# Patient Record
Sex: Female | Born: 1985 | Race: Asian | Hispanic: No | Marital: Married | Smoking: Never smoker
Health system: Southern US, Community
[De-identification: ages and names within clinical notes are randomized; demographics above are authoritative.]

---

## 2009-02-10 ENCOUNTER — Inpatient Hospital Stay (HOSPITAL_COMMUNITY): Admission: AD | Admit: 2009-02-10 | Discharge: 2009-02-10 | Payer: Self-pay | Admitting: Obstetrics and Gynecology

## 2009-02-10 ENCOUNTER — Ambulatory Visit: Payer: Self-pay | Admitting: Obstetrics and Gynecology

## 2009-02-12 ENCOUNTER — Inpatient Hospital Stay (HOSPITAL_COMMUNITY): Admission: AD | Admit: 2009-02-12 | Discharge: 2009-02-13 | Payer: Self-pay | Admitting: Obstetrics and Gynecology

## 2009-04-21 ENCOUNTER — Ambulatory Visit: Payer: Self-pay | Admitting: Obstetrics & Gynecology

## 2009-04-21 ENCOUNTER — Encounter: Payer: Self-pay | Admitting: Obstetrics & Gynecology

## 2009-04-21 LAB — CONVERTED CEMR LAB
Antibody Screen: NEGATIVE
Basophils Relative: 0 % (ref 0–1)
Eosinophils Absolute: 0.2 10*3/uL (ref 0.0–0.7)
Eosinophils Relative: 2 % (ref 0–5)
HCT: 36.4 % (ref 36.0–46.0)
Hemoglobin: 12.1 g/dL (ref 12.0–15.0)
Hgb F Quant: 0 % (ref 0.0–2.0)
Hgb S Quant: 0 % (ref 0.0–0.0)
Lymphocytes Relative: 20 % (ref 12–46)
Lymphs Abs: 2 10*3/uL (ref 0.7–4.0)
Neutro Abs: 7.4 10*3/uL (ref 1.7–7.7)
RBC: 4.07 M/uL (ref 3.87–5.11)
Rubella: 116.2 intl units/mL — ABNORMAL HIGH
WBC: 10.1 10*3/uL (ref 4.0–10.5)

## 2009-04-27 ENCOUNTER — Inpatient Hospital Stay (HOSPITAL_COMMUNITY): Admission: AD | Admit: 2009-04-27 | Discharge: 2009-04-27 | Payer: Self-pay | Admitting: Obstetrics & Gynecology

## 2009-06-02 ENCOUNTER — Encounter: Payer: Self-pay | Admitting: Family Medicine

## 2009-06-02 ENCOUNTER — Ambulatory Visit (HOSPITAL_COMMUNITY): Admission: RE | Admit: 2009-06-02 | Discharge: 2009-06-02 | Payer: Self-pay | Admitting: Family Medicine

## 2009-06-12 ENCOUNTER — Ambulatory Visit (HOSPITAL_COMMUNITY): Admission: RE | Admit: 2009-06-12 | Discharge: 2009-06-12 | Payer: Self-pay | Admitting: Family Medicine

## 2009-07-14 ENCOUNTER — Ambulatory Visit: Payer: Self-pay | Admitting: Obstetrics & Gynecology

## 2009-07-17 ENCOUNTER — Ambulatory Visit (HOSPITAL_COMMUNITY): Admission: RE | Admit: 2009-07-17 | Discharge: 2009-07-17 | Payer: Self-pay | Admitting: Obstetrics & Gynecology

## 2009-07-20 ENCOUNTER — Ambulatory Visit: Payer: Self-pay | Admitting: Obstetrics & Gynecology

## 2009-07-21 ENCOUNTER — Ambulatory Visit: Payer: Self-pay | Admitting: Family

## 2009-07-21 ENCOUNTER — Inpatient Hospital Stay (HOSPITAL_COMMUNITY): Admission: EM | Admit: 2009-07-21 | Discharge: 2009-07-24 | Payer: Self-pay | Admitting: Family Medicine

## 2009-12-31 ENCOUNTER — Emergency Department (HOSPITAL_COMMUNITY): Admission: EM | Admit: 2009-12-31 | Discharge: 2009-12-31 | Payer: Self-pay | Admitting: Emergency Medicine

## 2010-06-25 IMAGING — US US OB LIMITED
1 series · 6 of 6 positions shown · non-contrast
Comparison: none

OBSTETRICAL ULTRASOUND:
 This ultrasound exam was performed in the [HOSPITAL] Ultrasound Department.  The OB US report was generated in the [facility] system, and faxed to the ordering physician.  This report is also available in [HOSPITAL]?s [employer] and in [REDACTED] PACS.

[Series 1: us fetal bpp w/o nonstress · non-contrast · 0.35mm/px · 6 acquisitions, 6 frames shown]
[im 1/6]
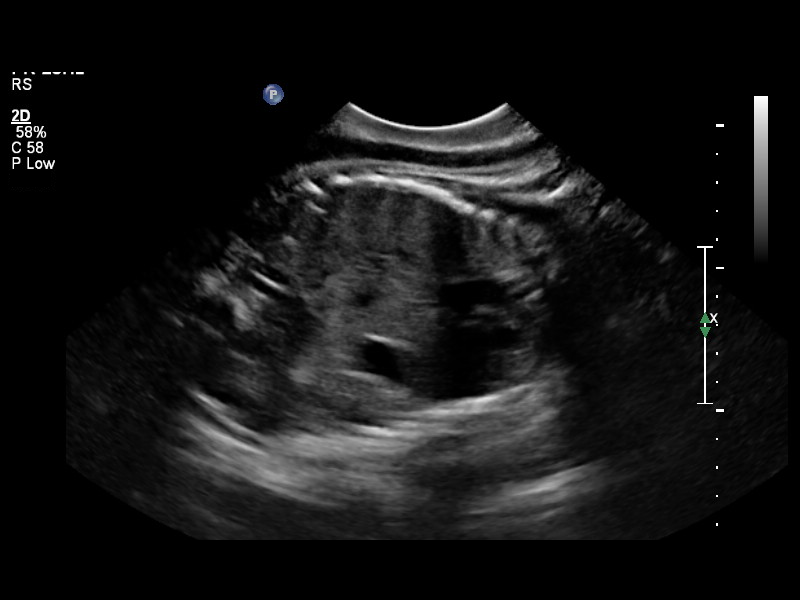
[im 2/6]
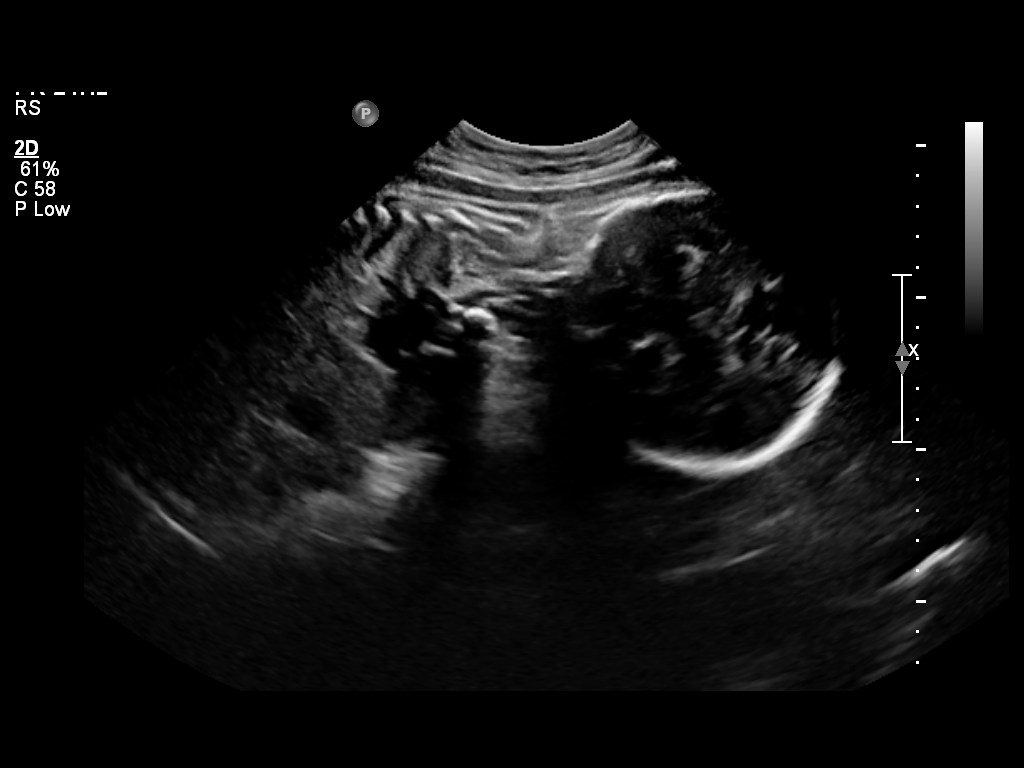
[im 3/6]
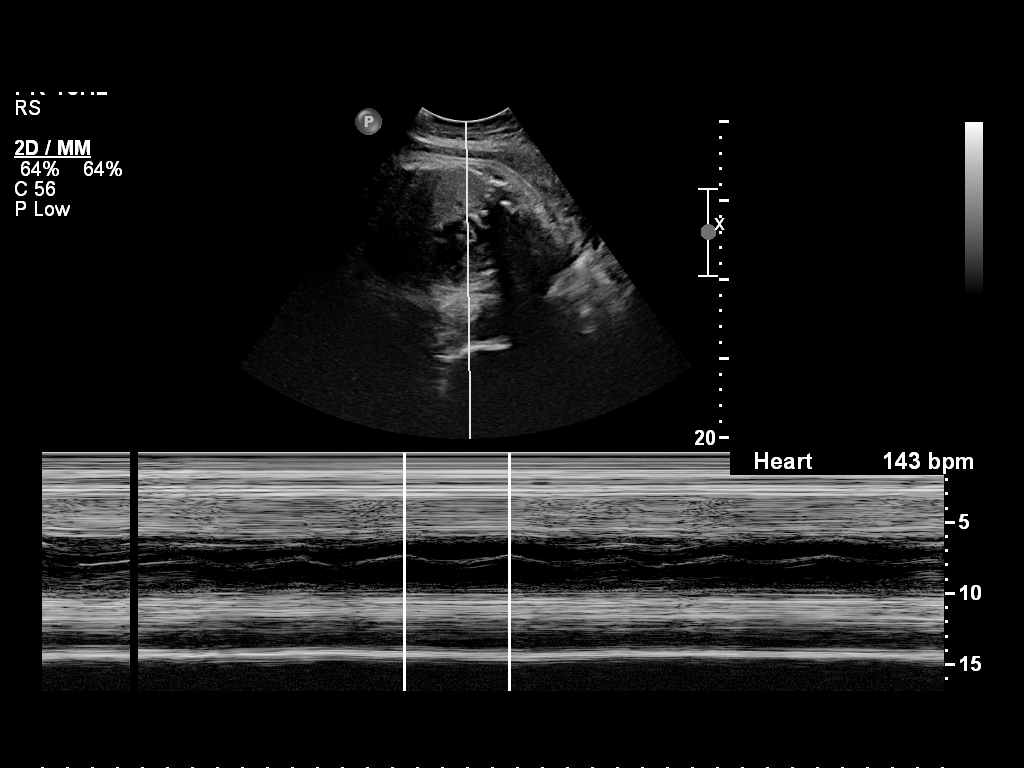
[im 4/6]
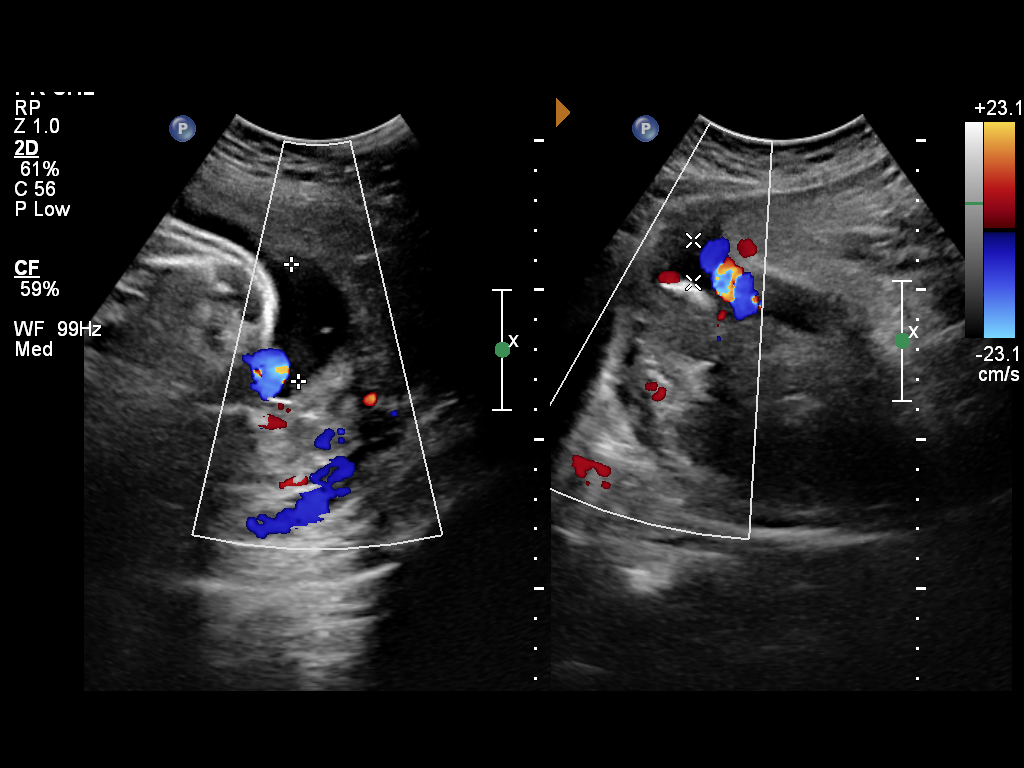
[im 5/6]
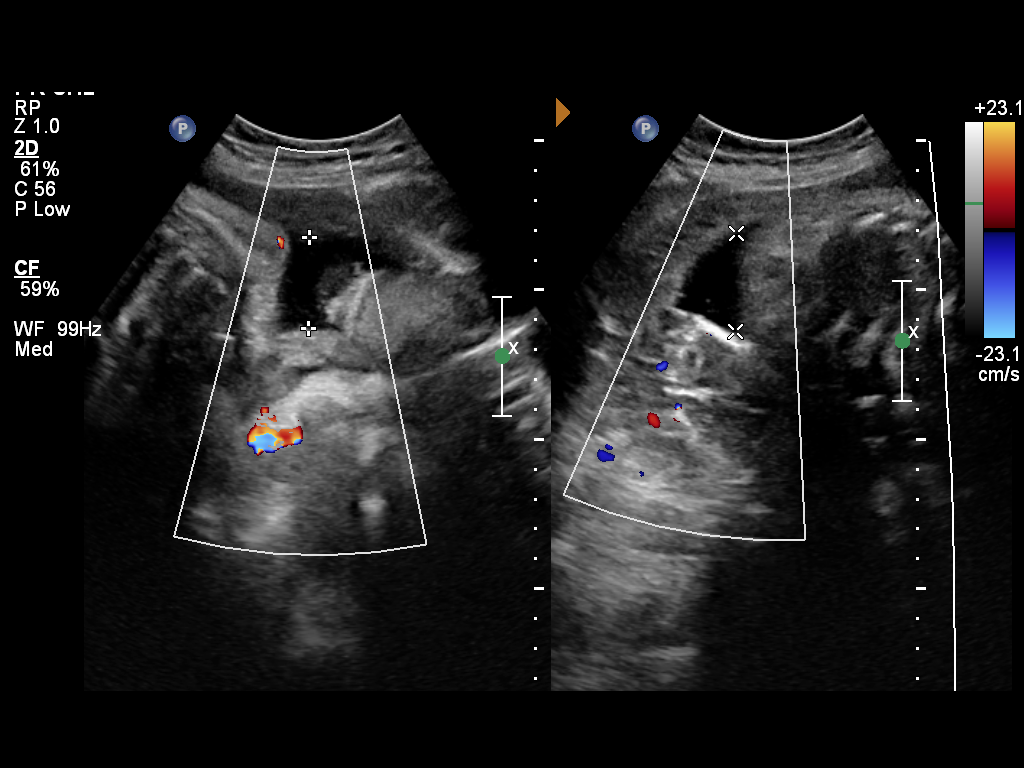
[im 6/6]
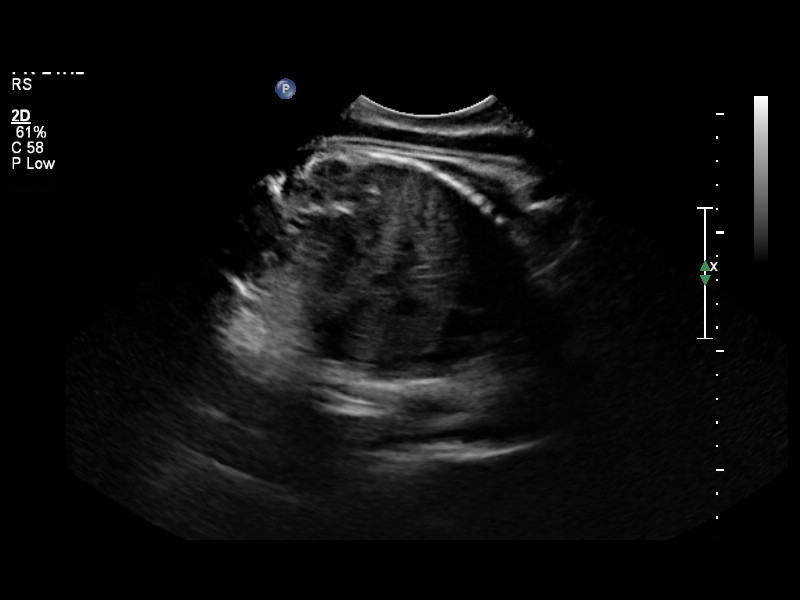

[6 of 6 positions shown; findings below may reference images not displayed]

IMPRESSION: See [facility] Obstetric US report.

## 2010-07-18 ENCOUNTER — Encounter: Payer: Self-pay | Admitting: Family Medicine

## 2010-09-12 LAB — DIFFERENTIAL
Eosinophils Absolute: 0.2 10*3/uL (ref 0.0–0.7)
Eosinophils Relative: 2 % (ref 0–5)
Lymphocytes Relative: 38 % (ref 12–46)
Lymphs Abs: 3.6 10*3/uL (ref 0.7–4.0)
Monocytes Relative: 6 % (ref 3–12)

## 2010-09-12 LAB — CBC
MCH: 30.5 pg (ref 26.0–34.0)
MCHC: 33.8 g/dL (ref 30.0–36.0)
MCHC: 35.1 g/dL (ref 30.0–36.0)
MCV: 92.2 fL (ref 78.0–100.0)
Platelets: 188 10*3/uL (ref 150–400)
Platelets: 134 10*3/uL — ABNORMAL LOW (ref 150–400)
RBC: 3.95 MIL/uL (ref 3.87–5.11)
RDW: 13.1 % (ref 11.5–15.5)
RDW: 14.6 % (ref 11.5–15.5)
WBC: 9.4 10*3/uL (ref 4.0–10.5)

## 2010-09-12 LAB — POCT I-STAT, CHEM 8
BUN: 12 mg/dL (ref 6–23)
Chloride: 106 mEq/L (ref 96–112)
Sodium: 139 mEq/L (ref 135–145)

## 2010-09-12 LAB — URINALYSIS, ROUTINE W REFLEX MICROSCOPIC
Bilirubin Urine: NEGATIVE
Glucose, UA: NEGATIVE mg/dL
Hgb urine dipstick: NEGATIVE
Ketones, ur: 15 mg/dL — AB
Protein, ur: NEGATIVE mg/dL
Specific Gravity, Urine: 1.026 (ref 1.005–1.030)
pH: 6 (ref 5.0–8.0)

## 2010-09-12 LAB — RPR: RPR Ser Ql: NONREACTIVE

## 2010-09-29 LAB — URINALYSIS, ROUTINE W REFLEX MICROSCOPIC
Glucose, UA: NEGATIVE mg/dL
Nitrite: NEGATIVE
Specific Gravity, Urine: 1.02 (ref 1.005–1.030)
pH: 6 (ref 5.0–8.0)

## 2010-09-29 LAB — URINE MICROSCOPIC-ADD ON

## 2010-09-29 LAB — GC/CHLAMYDIA PROBE AMP, GENITAL: Chlamydia, DNA Probe: NEGATIVE

## 2010-09-29 LAB — FETAL FIBRONECTIN: Fetal Fibronectin: NEGATIVE

## 2010-09-29 LAB — WET PREP, GENITAL: Clue Cells Wet Prep HPF POC: NONE SEEN

## 2010-10-02 LAB — URINALYSIS, ROUTINE W REFLEX MICROSCOPIC
Bilirubin Urine: NEGATIVE
Glucose, UA: NEGATIVE mg/dL
Hgb urine dipstick: NEGATIVE
Ketones, ur: NEGATIVE mg/dL
pH: 5.5 (ref 5.0–8.0)

## 2010-10-02 LAB — CBC
HCT: 40.2 % (ref 36.0–46.0)
Hemoglobin: 13.9 g/dL (ref 12.0–15.0)
MCV: 89.4 fL (ref 78.0–100.0)
Platelets: 179 10*3/uL (ref 150–400)
WBC: 10.8 10*3/uL — ABNORMAL HIGH (ref 4.0–10.5)

## 2010-10-02 LAB — COMPREHENSIVE METABOLIC PANEL
ALT: 16 U/L (ref 0–35)
AST: 21 U/L (ref 0–37)
Albumin: 3.7 g/dL (ref 3.5–5.2)
Alkaline Phosphatase: 56 U/L (ref 39–117)
Total Protein: 7 g/dL (ref 6.0–8.3)

## 2010-10-02 LAB — WET PREP, GENITAL: Yeast Wet Prep HPF POC: NONE SEEN

## 2010-10-02 LAB — URINE MICROSCOPIC-ADD ON

## 2010-10-02 LAB — GC/CHLAMYDIA PROBE AMP, GENITAL
Chlamydia, DNA Probe: POSITIVE — AB
GC Probe Amp, Genital: POSITIVE — AB

## 2017-04-06 ENCOUNTER — Emergency Department (HOSPITAL_BASED_OUTPATIENT_CLINIC_OR_DEPARTMENT_OTHER)
Admission: EM | Admit: 2017-04-06 | Discharge: 2017-04-06 | Disposition: A | Discharge status: Home / Self Care | Payer: Medicaid Other | Attending: Emergency Medicine | Admitting: Emergency Medicine

## 2017-04-06 ENCOUNTER — Emergency Department (HOSPITAL_BASED_OUTPATIENT_CLINIC_OR_DEPARTMENT_OTHER): Payer: Medicaid Other

## 2017-04-06 ENCOUNTER — Encounter (HOSPITAL_BASED_OUTPATIENT_CLINIC_OR_DEPARTMENT_OTHER): Payer: Self-pay | Admitting: *Deleted

## 2017-04-06 DIAGNOSIS — R0602 Shortness of breath: Secondary | ICD-10-CM | POA: Insufficient documentation

## 2017-04-06 DIAGNOSIS — R0789 Other chest pain: Secondary | ICD-10-CM | POA: Diagnosis present

## 2017-04-06 LAB — CBC WITH DIFFERENTIAL/PLATELET
Basophils Absolute: 0 10*3/uL (ref 0.0–0.1)
Basophils Relative: 0 %
EOS ABS: 0.2 10*3/uL (ref 0.0–0.7)
Eosinophils Relative: 2 %
HCT: 38.1 % (ref 36.0–46.0)
Hemoglobin: 13 g/dL (ref 12.0–15.0)
LYMPHS ABS: 4 10*3/uL (ref 0.7–4.0)
Lymphocytes Relative: 39 %
MCH: 29.7 pg (ref 26.0–34.0)
MCHC: 34.1 g/dL (ref 30.0–36.0)
MCV: 87 fL (ref 78.0–100.0)
MONO ABS: 0.7 10*3/uL (ref 0.1–1.0)
MONOS PCT: 7 %
Neutro Abs: 5.4 10*3/uL (ref 1.7–7.7)
Neutrophils Relative %: 52 %
PLATELETS: 192 10*3/uL (ref 150–400)
RBC: 4.38 MIL/uL (ref 3.87–5.11)
RDW: 14 % (ref 11.5–15.5)
WBC: 10.4 10*3/uL (ref 4.0–10.5)

## 2017-04-06 LAB — BASIC METABOLIC PANEL
Anion gap: 5 (ref 5–15)
BUN: 13 mg/dL (ref 6–20)
CALCIUM: 9 mg/dL (ref 8.9–10.3)
CO2: 26 mmol/L (ref 22–32)
CREATININE: 0.54 mg/dL (ref 0.44–1.00)
Chloride: 106 mmol/L (ref 101–111)
GFR calc Af Amer: >60 mL/min (ref >60)
GLUCOSE: 93 mg/dL (ref 65–99)
Potassium: 3.3 mmol/L — ABNORMAL LOW (ref 3.5–5.1)
SODIUM: 137 mmol/L (ref 135–145)

## 2017-04-06 LAB — TROPONIN I: Troponin I: <0.03 ng/mL (ref <0.03)

## 2017-04-06 LAB — PREGNANCY, URINE: Preg Test, Ur: NEGATIVE

## 2017-04-06 LAB — D-DIMER, QUANTITATIVE: D-Dimer, Quant: <0.27 ug/mL-FEU (ref 0.00–0.50)

## 2017-04-06 MED ORDER — GI COCKTAIL ~~LOC~~
30.0000 mL | Freq: Once | ORAL | Status: AC
  Administered 2017-04-06: 30 mL via ORAL
  Filled 2017-04-06: qty 30

## 2017-04-06 MED ORDER — PREDNISONE 20 MG PO TABS
40.0000 mg | ORAL_TABLET | Freq: Once | ORAL | Status: AC
  Administered 2017-04-06: 40 mg via ORAL
  Filled 2017-04-06: qty 2

## 2017-04-06 MED ORDER — PREDNISONE 20 MG PO TABS: 40.0000 mg | ORAL_TABLET | Freq: Every day | ORAL | 0 refills | Status: AC

## 2017-04-06 MED ORDER — OMEPRAZOLE 20 MG PO CPDR: 20.0000 mg | DELAYED_RELEASE_CAPSULE | Freq: Every day | ORAL | 0 refills | Status: AC

## 2017-04-06 MED ORDER — POTASSIUM CHLORIDE CRYS ER 20 MEQ PO TBCR
40.0000 meq | EXTENDED_RELEASE_TABLET | Freq: Once | ORAL | Status: AC
Start: 2017-04-06 — End: 2017-04-06
  Administered 2017-04-06: 40 meq via ORAL
  Filled 2017-04-06: qty 2

## 2017-04-06 MED ORDER — KETOROLAC TROMETHAMINE 30 MG/ML IJ SOLN
15.0000 mg | Freq: Once | INTRAMUSCULAR | Status: AC
  Administered 2017-04-06: 15 mg via INTRAVENOUS
  Filled 2017-04-06: qty 1

## 2017-04-06 MED ORDER — IPRATROPIUM-ALBUTEROL 0.5-2.5 (3) MG/3ML IN SOLN
3.0000 mL | Freq: Four times a day (QID) | RESPIRATORY_TRACT | Status: DC
  Administered 2017-04-06: 3 mL via RESPIRATORY_TRACT
  Filled 2017-04-06: qty 3

## 2017-04-06 NOTE — ED Notes (Signed)
Pt c/o feeling like she can't breath. Lungs clear, breathing unlabored, oxygen sat 100%. Assured pt she was breathing fine.

## 2017-04-06 NOTE — ED Notes (Signed)
ED Provider at bedside. 

## 2017-04-06 NOTE — ED Provider Notes (Signed)
[facility] Provider Note   CSN: [id number] Arrival date & time: 04/06/17  1802     History   Chief Complaint Chief Complaint  Patient presents with  . Chest Pain    HPI Holly Gallegos is a 31 y.o. female.  HPI 31 year old female with no significant past medical history presents to the ED with complaints of pleuritic chest pain. Patient states that the pain has been ongoing for 3 days. States the pain is under her left breast and radiated to her back. States that it is difficult to take a deep breath due to the pain. Reports associated shortness of breath. Denies any associated diaphoresis, nausea, emesis. Chest pain is not associated with exertion. Patient denies any recent travel, prolonged immobilization, recent hospitalizations/surgeries, unilateral leg swelling or calf tenderness, tobacco use, history of DVT/PE. States that she stopped oral contraceptives 2 months ago. Denies any cardiac history. Patient is not taking her symptoms prior to arrival. Denies any recent URI, cough, fevers. Nothing makes it better.  Pt denies any fever, chill, ha, vision changes, lightheadedness, dizziness, congestion, neck pain, cough, abd pain, n/v/d, urinary symptoms, change in bowel habits, melena, hematochezia, lower extremity paresthesias.  History reviewed. No pertinent past medical history.  There are no active problems to display for this patient.   History reviewed. No pertinent surgical history.  OB History    No data available       Home Medications    Prior to Admission medications   Not on File    Family History No family history on file.  Social History Social History  Substance Use Topics  . Smoking status: Never Smoker  . Smokeless tobacco: Never Used  . Alcohol use No     Allergies   Patient has no known allergies.   Review of Systems Review of Systems  Constitutional: Negative for chills, diaphoresis and fever.  HENT: Negative for  congestion.   Eyes: Negative for visual disturbance.  Respiratory: Positive for shortness of breath. Negative for cough and wheezing.   Cardiovascular: Positive for chest pain. Negative for palpitations and leg swelling.  Gastrointestinal: Negative for abdominal pain, diarrhea, nausea and vomiting.  Genitourinary: Negative for dysuria, flank pain, frequency, hematuria and urgency.  Musculoskeletal: Negative for arthralgias and myalgias.  Skin: Negative for rash.  Neurological: Negative for dizziness, syncope, weakness, light-headedness, numbness and headaches.  Psychiatric/Behavioral: Negative for sleep disturbance. The patient is not nervous/anxious.      Physical Exam Updated Vital Signs BP 106/79 (BP Location: Right Arm)   Pulse 73   Temp 98.5 F (36.9 C) (Oral)   Resp 16   Ht  (1.702 m)   Wt 62.1 kg (137 lb)   LMP 03/30/2017 (Exact Date)   SpO2 100%   BMI 21.46 kg/m   Physical Exam  Constitutional: She is oriented to person, place, and time. She appears well-developed and well-nourished.  Non-toxic appearance. No distress.  HENT:  Head: Normocephalic and atraumatic.  Nose: Nose normal.  Mouth/Throat: Oropharynx is clear and moist.  Eyes: Pupils are equal, round, and reactive to light. Conjunctivae are normal. Right eye exhibits no discharge. Left eye exhibits no discharge.  Neck: Normal range of motion. Neck supple. No JVD present. No tracheal deviation present.  Cardiovascular: Normal rate, regular rhythm, normal heart sounds and intact distal pulses.  Exam reveals no gallop and no friction rub.   No murmur heard. Pulmonary/Chest: Effort normal and breath sounds normal. No respiratory distress. She has no wheezes. She has  no rales. She exhibits tenderness (left anterior chest wall).  No hypoxia or tachypnea.  Abdominal: Soft. Bowel sounds are normal. She exhibits no distension. There is no tenderness. There is no rebound and no guarding.  Musculoskeletal: Normal  range of motion.  No lower extremity edema or calf tenderness.  Lymphadenopathy:    She has no cervical adenopathy.  Neurological: She is alert and oriented to person, place, and time.  Skin: Skin is warm and dry. Capillary refill takes less than 2 seconds. She is not diaphoretic.  Psychiatric: Her behavior is normal. Judgment and thought content normal.  Nursing note and vitals reviewed.    ED Treatments / Results  Labs (all labs ordered are listed, but only abnormal results are displayed) Labs Reviewed  BASIC METABOLIC PANEL - Abnormal; Notable for the following:       Result Value   Potassium 3.3 (*)    All other components within normal limits  CBC WITH DIFFERENTIAL/PLATELET  TROPONIN I  D-DIMER, QUANTITATIVE (NOT AT [facility])  PREGNANCY, URINE  TROPONIN I    EKG  EKG Interpretation  Date/Time:  Thursday April 06 2017 18:22:50 EDT Ventricular Rate:  78 PR Interval:    QRS Duration: 82 QT Interval:  375 QTC Calculation: 428 R Axis:   60 Text Interpretation:  Sinus rhythm Borderline short PR interval No previous ECGs available Confirmed by Vanetta Mulders [id number]) on 04/06/2017 6:29:05 PM       Radiology Dg Chest 2 View  Result Date: 04/06/2017 CLINICAL DATA:  Chest pain EXAM: CHEST  2 VIEW COMPARISON:  None. FINDINGS: The heart size and mediastinal contours are within normal limits. Both lungs are clear. The visualized skeletal structures are unremarkable. IMPRESSION: No active cardiopulmonary disease. Electronically Signed   By: Deatra Robinson M.D.   On: 04/06/2017 18:53    Procedures Procedures (including critical care time)  Medications Ordered in ED Medications  ipratropium-albuterol (DUONEB) 0.5-2.5 (3) MG/3ML nebulizer solution 3 mL (3 mLs Nebulization Given 04/06/17 2023)  gi cocktail (Maalox,Lidocaine,Donnatal) (30 mLs Oral Given 04/06/17 1833)  ketorolac (TORADOL) 30 MG/ML injection 15 mg (15 mg Intravenous Given 04/06/17 1952)  predniSONE (DELTASONE)  tablet 40 mg (40 mg Oral Given 04/06/17 2209)  potassium chloride SA (K-DUR,KLOR-CON) CR tablet 40 mEq (40 mEq Oral Given 04/06/17 2209)     Initial Impression / Assessment and Plan / ED Course  I have reviewed the triage vital signs and the nursing notes.  Pertinent labs & imaging results that were available during my care of the patient were reviewed by me and considered in my medical decision making (see chart for details).     Patient presented to the ED with complaints of chest pain or shortness of breath. Chest pain is pleuritic. Pt denies any associated cough, URI symptoms, hemoptysis, fevers.  Patient is overall well-appearing and nontoxic. Vital signs are reassuring. Patient is not hypoxic, no tachypnea, no tachycardia noted.  Lungs clear to auscultation bilaterally. Chest pain is reproducible on palpation. No lower extremity edema or calf tenderness.  Laboratory is reassuring. No leukocytosis. Mild hypokalemia 3.3 that was replaced orally. Delta troponin is negative. D dimer is negative. EKG show no ischemic changes. Chest xray unremarkable.   Pt pain and sob improved with breathing treatment, gi cocktail, and tordol.   Pt presentation is very atypical for acs. Low risk wells and negative d dimer low suspicion for PE. Presentation does not seem consistent with dissection or pneumonia.  Patient remains overall well-appearing and feels much improved.  Able to ambulate with normal saturations. Feel stable for discharge.  Pt is hemodynamically stable, in NAD, & able to ambulate in the ED. Evaluation does not show pathology that would require ongoing emergent intervention or inpatient treatment. I explained the diagnosis to the patient. Pain has been managed & has no complaints prior to dc. Pt is comfortable with above plan and is stable for discharge at this time. All questions were answered prior to disposition. Strict return precautions for f/u to the ED were discussed. Encouraged  follow up with PCP.     Final Clinical Impressions(s) / ED Diagnoses   Final diagnoses:  Atypical chest pain  SOB (shortness of breath)    New Prescriptions Discharge Medication List as of 04/06/2017 10:15 PM    START taking these medications   Details  omeprazole (PRILOSEC) 20 MG capsule Take 1 capsule (20 mg total) by mouth daily., Starting Thu 04/06/2017, Print    predniSONE (DELTASONE) 20 MG tablet Take 2 tablets (40 mg total) by mouth daily., Starting Thu 04/06/2017, Print         Rise Mu, PA-C 04/07/17 0115    Vanetta Mulders, MD 04/07/17 [phone]

## 2017-04-06 NOTE — ED Triage Notes (Signed)
Pt reports intermittent chest pain x3days with some sob. Denies n/v, fever. Denies recent travel, denies recent childbirth. Pt able to speak in complete sentences.

## 2017-04-06 NOTE — Discharge Instructions (Signed)
Your lab work and imaging as been reassuring. Unknown cause of your symptoms. No signs of blood cot or pneumonia today. Please take the prednisone starting tomorrow once a day for 3 days. Your potassium was slightly low. Please have this rechecked by a primary care doctor. May take motrin and tylenol for pain. Have also given you prescription for Prilosec to take once a day for acid reflux. Please follow up with your primary care doctor. Return to the Ed if you develop worsening symptoms.

## 2017-04-06 NOTE — ED Notes (Addendum)
Denies pain but c/o difficulty breathing, started 3 days ago.

## 2018-03-15 IMAGING — CR DG CHEST 2V
2 series · 2 of 2 positions shown · non-contrast
Comparison: None.

CLINICAL DATA: Chest pain

EXAM:
CHEST  2 VIEW

[w chest pa]
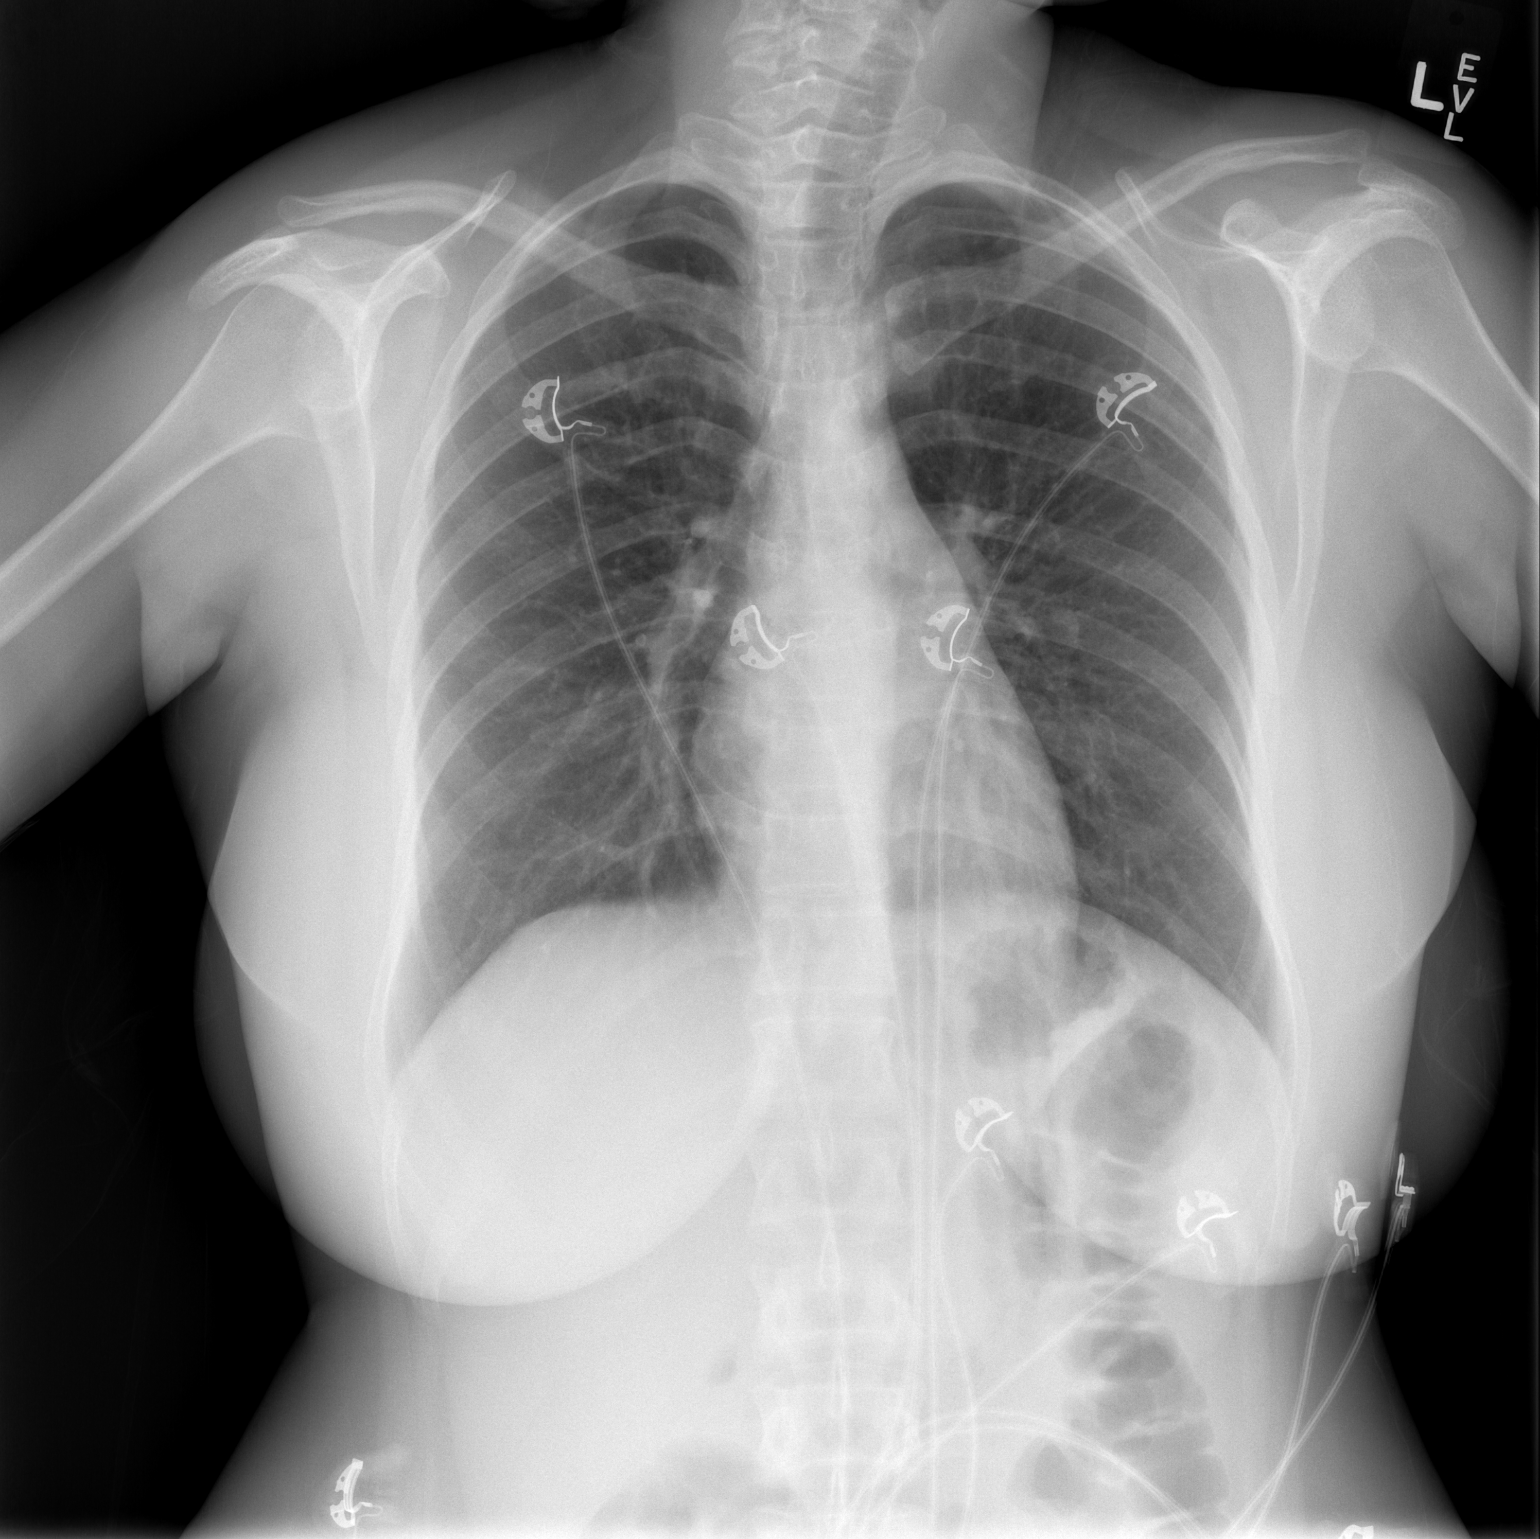

[w chest lat]
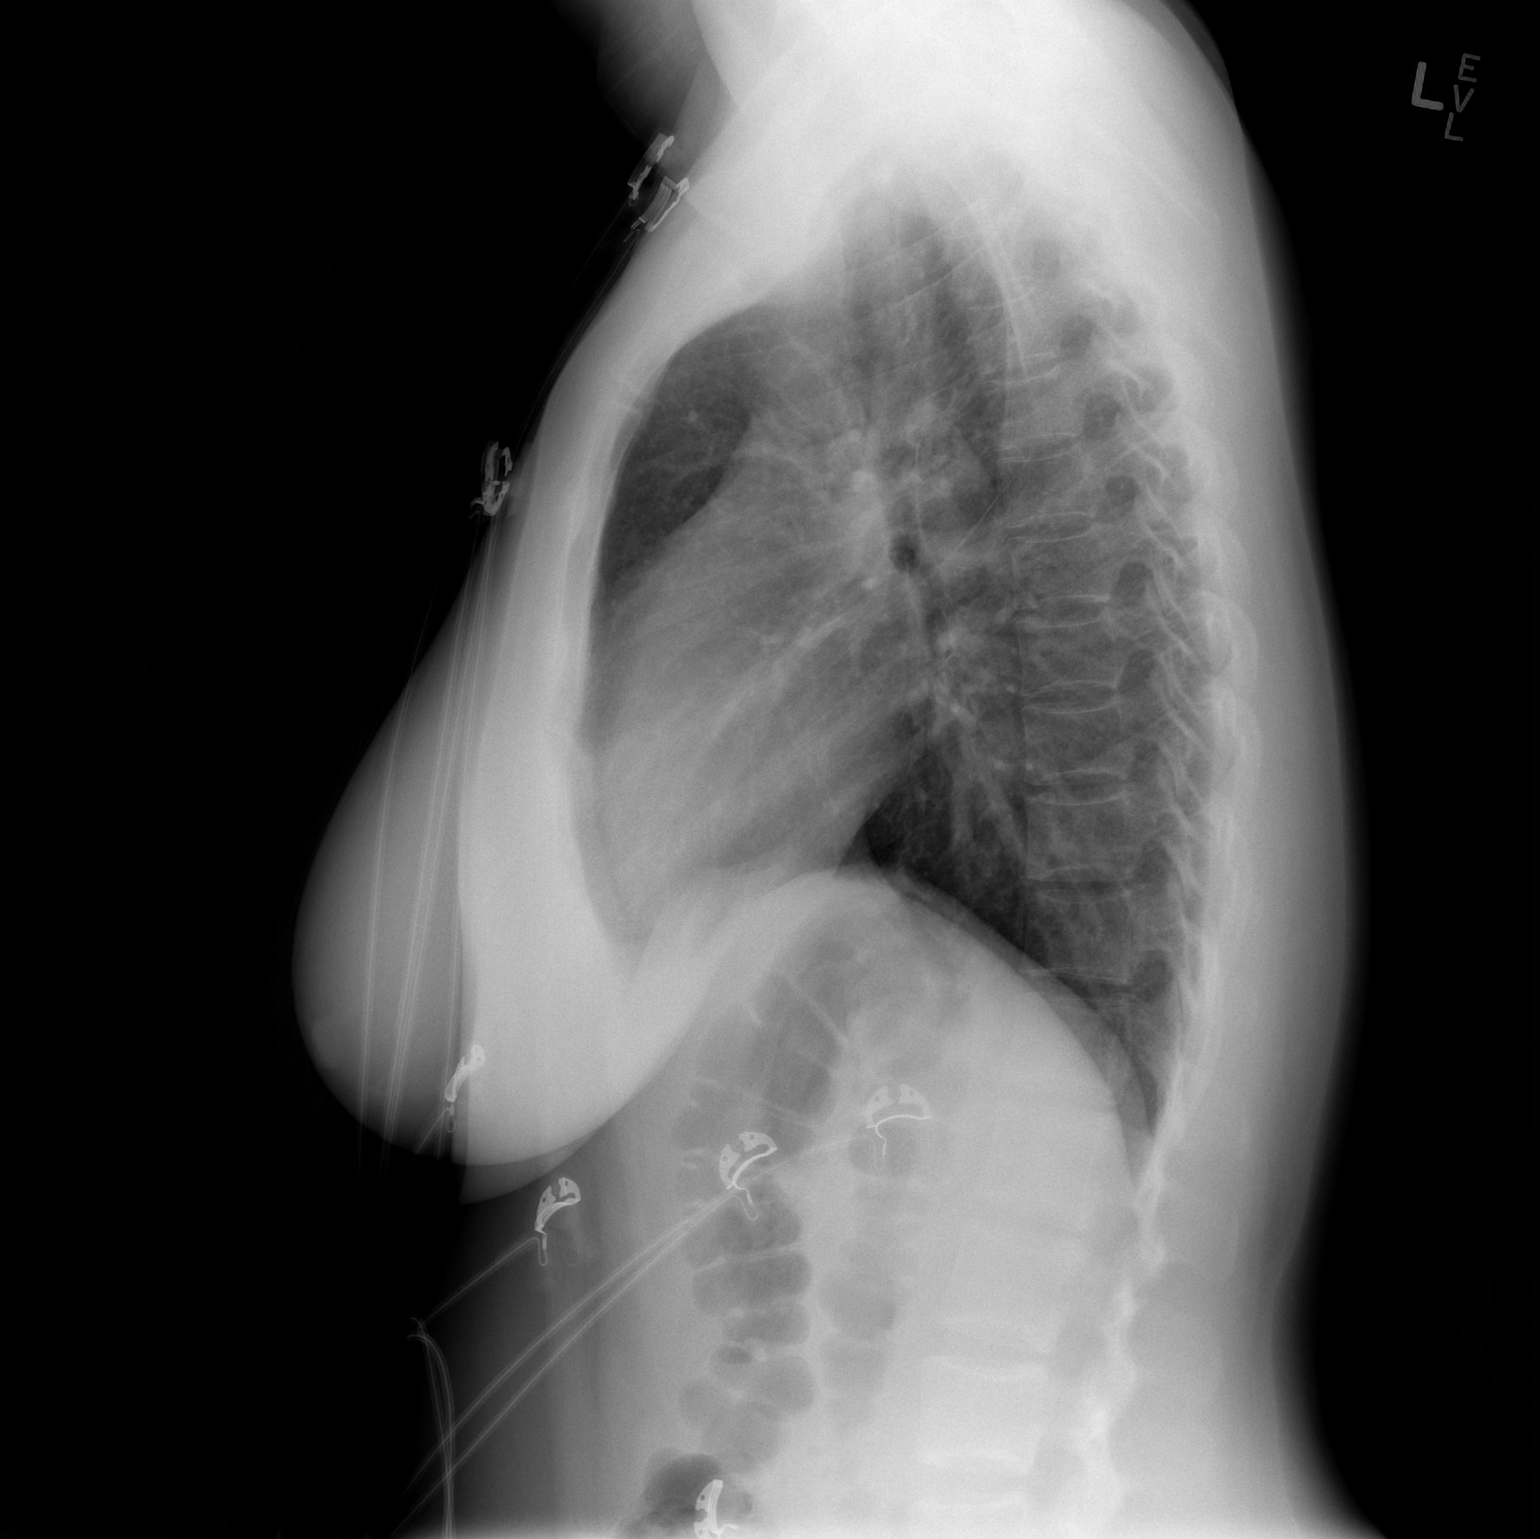

[2 of 2 positions shown; findings below may reference images not displayed]

FINDINGS: The heart size and mediastinal contours are within normal limits.
Both lungs are clear. The visualized skeletal structures are
unremarkable.
IMPRESSION: No active cardiopulmonary disease.
